# Patient Record
Sex: Female | Born: 1935 | Race: White | Hispanic: No
Health system: Southern US, Community
[De-identification: ages and names within clinical notes are randomized; demographics above are authoritative.]

---

## 2004-03-26 ENCOUNTER — Ambulatory Visit: Payer: Self-pay | Admitting: Neurology

## 2005-08-13 ENCOUNTER — Ambulatory Visit: Payer: Self-pay | Admitting: Unknown Physician Specialty

## 2006-04-15 ENCOUNTER — Other Ambulatory Visit: Payer: Self-pay

## 2006-04-15 ENCOUNTER — Inpatient Hospital Stay: Payer: Self-pay | Admitting: Specialist

## 2006-05-06 ENCOUNTER — Ambulatory Visit: Payer: Self-pay | Admitting: Internal Medicine

## 2006-05-26 ENCOUNTER — Ambulatory Visit: Payer: Self-pay | Admitting: Internal Medicine

## 2007-01-09 ENCOUNTER — Emergency Department: Payer: Self-pay | Admitting: Emergency Medicine

## 2007-01-28 ENCOUNTER — Other Ambulatory Visit: Payer: Self-pay

## 2007-01-28 ENCOUNTER — Observation Stay: Payer: Self-pay | Admitting: Internal Medicine

## 2007-12-02 ENCOUNTER — Ambulatory Visit: Payer: Self-pay | Admitting: Internal Medicine

## 2009-08-31 ENCOUNTER — Inpatient Hospital Stay: Payer: Self-pay | Admitting: Internal Medicine

## 2009-11-20 ENCOUNTER — Ambulatory Visit: Payer: Self-pay | Admitting: Internal Medicine

## 2011-02-28 ENCOUNTER — Telehealth: Payer: Self-pay | Admitting: *Deleted

## 2011-02-28 DIAGNOSIS — B029 Zoster without complications: Secondary | ICD-10-CM

## 2011-02-28 MED ORDER — ACYCLOVIR 400 MG PO TABS: 400.0000 mg | ORAL_TABLET | Freq: Every day | ORAL | Status: DC

## 2011-02-28 MED ORDER — ACYCLOVIR 400 MG PO TABS: 400.0000 mg | ORAL_TABLET | Freq: Every day | ORAL | Status: AC

## 2011-02-28 NOTE — Telephone Encounter (Signed)
Hospice RN called, She said she spoke with Dr Dan Humphreys in DR Tullo's absence yesterday. RN is concerned about shingles and labs were ordered to determine kidney function. They should have been faxed over today, will look for labs now.

## 2011-02-28 NOTE — Telephone Encounter (Signed)
Message copied by Duncan Dull on Fri Feb 28, 2011  1:05 PM ------      Message from: Ronna Polio A      Created: Thu Feb 27, 2011  4:43 PM      Regarding: Possible shingles       Pt hospice nurse called to tell you that pt has rash over hip and extending down leg which she thinks may be shingles.  Rash was described as purple streak with some possible vesicles.        Did not sound like cellulitis (or shingles really). Pt is afebrile. Pt reportedly has h/o kidney disease. We have no records in Epic on her and I am not familiar with her.      Nurse wanted to start Valtrex, but I was concerned without knowing her renal function/liver function. Nurse did not know why pt on hospice?      So, nurse drawing CBC and CMP and will touch base with you tomorrow.      Thanks

## 2011-02-28 NOTE — Telephone Encounter (Signed)
Lab recived, pending review from MD

## 2011-02-28 NOTE — Telephone Encounter (Signed)
Labs are normal,  agre with starting acyclovir 400 mg 5 times daily x 10 days.  EPIC updated.  Can you call in?  thanks

## 2011-02-28 NOTE — Telephone Encounter (Signed)
Hospice RN informed, RX sent in and order faxed to Hospice - Fax # [phone]

## 2011-03-20 ENCOUNTER — Encounter: Payer: Self-pay | Admitting: Internal Medicine

## 2011-05-13 ENCOUNTER — Ambulatory Visit: Payer: Self-pay | Admitting: Internal Medicine

## 2011-05-13 LAB — CBC WITH DIFFERENTIAL/PLATELET
Basophil #: 0 x10 3/mm 3
Basophil %: 0.2 %
Eosinophil #: 0.1 x10 3/mm 3
Eosinophil %: 0.3 %
HCT: 39.1 %
HGB: 13.4 g/dL
Lymphocyte %: 13 %
Lymphs Abs: 2.5 x10 3/mm 3
MCH: 30.6 pg
MCHC: 34.4 g/dL
MCV: 89 fL
Monocyte #: 0.9 x10 3/mm 3 — ABNORMAL HIGH
Monocyte %: 4.9 %
Neutrophil #: 15.6 x10 3/mm 3 — ABNORMAL HIGH
Neutrophil %: 81.6 %
Platelet: 189 x10 3/mm 3
RBC: 4.39 X10 6/mm 3
RDW: 12.1 %
WBC: 19.1 x10 3/mm 3 — ABNORMAL HIGH

## 2011-05-13 LAB — COMPREHENSIVE METABOLIC PANEL
Anion Gap: 8 (ref 7–16)
Calcium, Total: 9.5 mg/dL (ref 8.5–10.1)
Chloride: 107 mmol/L (ref 98–107)
Co2: 32 mmol/L (ref 21–32)
EGFR (African American): >60
EGFR (Non-African Amer.): 52 — ABNORMAL LOW
Potassium: 3.6 mmol/L (ref 3.5–5.1)
SGOT(AST): 27 U/L (ref 15–37)
SGPT (ALT): 25 U/L

## 2011-05-14 ENCOUNTER — Inpatient Hospital Stay: Payer: Self-pay | Admitting: Internal Medicine

## 2011-05-14 LAB — URINALYSIS, COMPLETE
Bilirubin,UR: NEGATIVE
Glucose,UR: NEGATIVE mg/dL (ref 0–75)
Hyaline Cast: 1
Leukocyte Esterase: NEGATIVE
RBC,UR: 4 /HPF (ref 0–5)
Squamous Epithelial: 3
WBC UR: 4 /HPF (ref 0–5)

## 2011-05-15 LAB — BASIC METABOLIC PANEL
BUN: 27 mg/dL — ABNORMAL HIGH (ref 7–18)
Calcium, Total: 8.1 mg/dL — ABNORMAL LOW (ref 8.5–10.1)
Chloride: 112 mmol/L — ABNORMAL HIGH (ref 98–107)
Co2: 25 mmol/L (ref 21–32)
EGFR (Non-African Amer.): 51 — ABNORMAL LOW
Osmolality: 306 (ref 275–301)
Potassium: 2.9 mmol/L — ABNORMAL LOW (ref 3.5–5.1)
Sodium: 149 mmol/L — ABNORMAL HIGH (ref 136–145)

## 2011-05-15 LAB — CBC WITH DIFFERENTIAL/PLATELET
Basophil %: 0.6 %
Eosinophil %: 2.9 %
HGB: 10.3 g/dL — ABNORMAL LOW (ref 12.0–16.0)
Lymphocyte #: 2.9 10*3/uL (ref 1.0–3.6)
MCV: 89 fL (ref 80–100)
Monocyte %: 6.2 %
Neutrophil #: 7.5 10*3/uL — ABNORMAL HIGH (ref 1.4–6.5)
RBC: 3.41 10*6/uL — ABNORMAL LOW (ref 3.80–5.20)

## 2011-05-15 LAB — LIPID PANEL
Cholesterol: 132 mg/dL (ref 0–200)
VLDL Cholesterol, Calc: 29 mg/dL (ref 5–40)

## 2011-05-17 LAB — CBC WITH DIFFERENTIAL/PLATELET
Basophil %: 0.6 %
Eosinophil %: 4.9 %
HGB: 10 g/dL — ABNORMAL LOW (ref 12.0–16.0)
Lymphocyte #: 2.8 10*3/uL (ref 1.0–3.6)
Lymphocyte %: 30 %
MCH: 30.1 pg (ref 26.0–34.0)
MCV: 89 fL (ref 80–100)
Monocyte #: 0.7 10*3/uL (ref 0.0–0.7)
Monocyte %: 6.9 %
Neutrophil #: 5.4 10*3/uL (ref 1.4–6.5)
Neutrophil %: 57.6 %
RBC: 3.34 10*6/uL — ABNORMAL LOW (ref 3.80–5.20)
WBC: 9.4 10*3/uL (ref 3.6–11.0)

## 2011-05-17 LAB — RENAL FUNCTION PANEL
Albumin: 2.2 g/dL — ABNORMAL LOW (ref 3.4–5.0)
Anion Gap: 11 (ref 7–16)
Calcium, Total: 7.8 mg/dL — ABNORMAL LOW (ref 8.5–10.1)
Creatinine: 0.84 mg/dL (ref 0.60–1.30)
EGFR (African American): >60
Glucose: 140 mg/dL — ABNORMAL HIGH (ref 65–99)
Osmolality: 287 (ref 275–301)
Potassium: 3.7 mmol/L (ref 3.5–5.1)
Sodium: 143 mmol/L (ref 136–145)

## 2011-05-17 LAB — MAGNESIUM: Magnesium: 1.8 mg/dL

## 2011-05-17 LAB — SEDIMENTATION RATE: Erythrocyte Sed Rate: 63 mm/hr — ABNORMAL HIGH (ref 0–30)

## 2011-05-17 LAB — OCCULT BLOOD X 1 CARD TO LAB, STOOL: Occult Blood, Feces: NEGATIVE

## 2011-05-17 LAB — CLOSTRIDIUM DIFFICILE BY PCR

## 2011-05-18 LAB — RENAL FUNCTION PANEL
Anion Gap: 8 (ref 7–16)
BUN: 9 mg/dL (ref 7–18)
Calcium, Total: 7.9 mg/dL — ABNORMAL LOW (ref 8.5–10.1)
EGFR (African American): >60
EGFR (Non-African Amer.): >60
Osmolality: 287 (ref 275–301)
Phosphorus: 2.2 mg/dL — ABNORMAL LOW (ref 2.5–4.9)
Sodium: 144 mmol/L (ref 136–145)

## 2011-05-19 ENCOUNTER — Encounter: Payer: Self-pay | Admitting: Internal Medicine

## 2011-05-19 ENCOUNTER — Telehealth: Payer: Self-pay | Admitting: *Deleted

## 2011-05-19 DIAGNOSIS — G060 Intracranial abscess and granuloma: Secondary | ICD-10-CM | POA: Insufficient documentation

## 2011-05-19 DIAGNOSIS — E119 Type 2 diabetes mellitus without complications: Secondary | ICD-10-CM | POA: Insufficient documentation

## 2011-05-19 DIAGNOSIS — D539 Nutritional anemia, unspecified: Secondary | ICD-10-CM | POA: Insufficient documentation

## 2011-05-19 DIAGNOSIS — R197 Diarrhea, unspecified: Secondary | ICD-10-CM | POA: Insufficient documentation

## 2011-05-19 DIAGNOSIS — F0390 Unspecified dementia without behavioral disturbance: Secondary | ICD-10-CM | POA: Insufficient documentation

## 2011-05-19 DIAGNOSIS — K1120 Sialoadenitis, unspecified: Secondary | ICD-10-CM | POA: Insufficient documentation

## 2011-05-19 LAB — STOOL CULTURE

## 2011-05-19 LAB — CULTURE, BLOOD (SINGLE)

## 2011-05-19 NOTE — Telephone Encounter (Signed)
Cbc with diff,  ESR, and vancomycin trough

## 2011-05-19 NOTE — Telephone Encounter (Signed)
Home health nurse called asking for verbal ok to draw labs on 1/12 and weekly thereafter.  Please advise.

## 2011-05-20 ENCOUNTER — Telehealth: Payer: Self-pay | Admitting: *Deleted

## 2011-05-20 NOTE — Telephone Encounter (Signed)
Qty 30 on amlodipine, seroquel, and lomotil,.  Lantus is one bottle/vial  Per month.   3 refills on all.

## 2011-05-20 NOTE — Telephone Encounter (Signed)
Advised pharmacist at [facility].  Quantity of 60 given on seroquel because the patient takes one twice a day.  Also ok'd sertraline 100 mg's twice a day for a quantity of 60 with 3 refills.  Home health nurse had not mentioned that one.

## 2011-05-20 NOTE — Telephone Encounter (Signed)
Pt was recently discharged from the hospital.  Her meds were called to [facility] but quantities weren't called in.  We need to call in the quantities so that the meds can be filled.  The meds are norvasc, seroquel, lomotil, lantus.

## 2011-05-20 NOTE — Telephone Encounter (Signed)
Advised home health nurse

## 2011-05-28 ENCOUNTER — Telehealth: Payer: Self-pay | Admitting: Internal Medicine

## 2011-05-28 ENCOUNTER — Encounter: Payer: Self-pay | Admitting: Internal Medicine

## 2011-05-28 NOTE — Telephone Encounter (Signed)
Brandy Pacheco's vancomycin trough level was too high,  Please call [facility] to find out if they have a pharmacist who can adjust her vancomycin dose.  I was told when she was discharged that the company that supplied the medication could do this

## 2011-05-28 NOTE — Telephone Encounter (Signed)
Spoke with nurse Marchelle Folks.  She will check with her office and make sure they faxed a copy of pt's lab results to the supplier.  She will let us know.

## 2011-05-30 ENCOUNTER — Telehealth: Payer: Self-pay | Admitting: *Deleted

## 2011-05-30 ENCOUNTER — Ambulatory Visit: Payer: Self-pay | Admitting: Internal Medicine

## 2011-05-30 ENCOUNTER — Inpatient Hospital Stay: Payer: Self-pay | Admitting: *Deleted

## 2011-05-30 LAB — COMPREHENSIVE METABOLIC PANEL
Albumin: 2.4 g/dL — ABNORMAL LOW (ref 3.4–5.0)
Alkaline Phosphatase: 56 U/L (ref 50–136)
Anion Gap: 14 (ref 7–16)
BUN: 9 mg/dL (ref 7–18)
Bilirubin,Total: 0.4 mg/dL (ref 0.2–1.0)
Co2: 30 mmol/L (ref 21–32)
Creatinine: 1.02 mg/dL (ref 0.60–1.30)
EGFR (African American): >60
EGFR (Non-African Amer.): 55 — ABNORMAL LOW
Glucose: 179 mg/dL — ABNORMAL HIGH (ref 65–99)
Osmolality: 292 (ref 275–301)
SGOT(AST): 22 U/L (ref 15–37)
SGPT (ALT): 13 U/L
Total Protein: 7.1 g/dL (ref 6.4–8.2)

## 2011-05-30 LAB — CBC
HCT: 31.7 % — ABNORMAL LOW (ref 35.0–47.0)
HGB: 10.9 g/dL — ABNORMAL LOW (ref 12.0–16.0)
MCH: 30.2 pg (ref 26.0–34.0)
MCHC: 34.4 g/dL (ref 32.0–36.0)
Platelet: 258 10*3/uL (ref 150–440)
RBC: 3.61 10*6/uL — ABNORMAL LOW (ref 3.80–5.20)
RDW: 13.4 % (ref 11.5–14.5)

## 2011-05-30 LAB — CK TOTAL AND CKMB (NOT AT ARMC): CK-MB: <0.5 ng/mL — ABNORMAL LOW (ref 0.5–3.6)

## 2011-05-30 LAB — VANCOMYCIN, TROUGH: Vancomycin, Trough: 39 ug/mL (ref 10–20)

## 2011-05-30 NOTE — Telephone Encounter (Signed)
Home health nurse called to report that pt is at [facility] with chest pain and BP of 199/91.  Magnesium and potassium levels are down so they will probably keep her there.

## 2011-05-31 LAB — BASIC METABOLIC PANEL
Anion Gap: 10 (ref 7–16)
BUN: 10 mg/dL (ref 7–18)
Calcium, Total: 7.9 mg/dL — ABNORMAL LOW (ref 8.5–10.1)
Co2: 30 mmol/L (ref 21–32)
EGFR (African American): 57 — ABNORMAL LOW
EGFR (Non-African Amer.): 47 — ABNORMAL LOW
Glucose: 170 mg/dL — ABNORMAL HIGH (ref 65–99)
Osmolality: 286 (ref 275–301)
Potassium: 2.4 mmol/L — CL (ref 3.5–5.1)
Sodium: 142 mmol/L (ref 136–145)

## 2011-05-31 LAB — VANCOMYCIN, TROUGH: Vancomycin, Trough: 24 ug/mL (ref 10–20)

## 2011-05-31 LAB — MAGNESIUM: Magnesium: 2.2 mg/dL

## 2011-06-01 LAB — BASIC METABOLIC PANEL
Anion Gap: 10 (ref 7–16)
BUN: 12 mg/dL (ref 7–18)
Chloride: 108 mmol/L — ABNORMAL HIGH (ref 98–107)
Co2: 29 mmol/L (ref 21–32)
Creatinine: 1.32 mg/dL — ABNORMAL HIGH (ref 0.60–1.30)
Potassium: 3.1 mmol/L — ABNORMAL LOW (ref 3.5–5.1)

## 2011-06-02 ENCOUNTER — Telehealth: Payer: Self-pay | Admitting: Internal Medicine

## 2011-06-02 LAB — BASIC METABOLIC PANEL
BUN: 13 mg/dL (ref 7–18)
Chloride: 107 mmol/L (ref 98–107)
EGFR (Non-African Amer.): 47 — ABNORMAL LOW
Glucose: 127 mg/dL — ABNORMAL HIGH (ref 65–99)
Osmolality: 296 (ref 275–301)
Potassium: 2.9 mmol/L — ABNORMAL LOW (ref 3.5–5.1)
Sodium: 148 mmol/L — ABNORMAL HIGH (ref 136–145)

## 2011-06-02 LAB — POTASSIUM: Potassium: 3.9 mmol/L (ref 3.5–5.1)

## 2011-06-02 LAB — SODIUM: Sodium: 148 mmol/L — ABNORMAL HIGH (ref 136–145)

## 2011-06-02 LAB — MAGNESIUM: Magnesium: 1.5 mg/dL — ABNORMAL LOW

## 2011-06-02 NOTE — Telephone Encounter (Signed)
[phone]    FAX [phone] BONNIE @ [facility] CALLED WANTING LAST NOTES FOR [facility]     DID WE RECEIVE RESULTS FOR THE CT OF HEAD WITH AND WITH OUT CONTRAST 05/30/11 ON ORDER IT STATED DR TULLO WANTED 2 WEEK FOLLOW UP/[facility]

## 2011-06-03 LAB — BASIC METABOLIC PANEL
Calcium, Total: 7.7 mg/dL — ABNORMAL LOW (ref 8.5–10.1)
Co2: 30 mmol/L (ref 21–32)
EGFR (Non-African Amer.): 49 — ABNORMAL LOW
Osmolality: 294 (ref 275–301)
Potassium: 3.6 mmol/L (ref 3.5–5.1)
Sodium: 148 mmol/L — ABNORMAL HIGH (ref 136–145)

## 2011-06-03 LAB — SODIUM: Sodium: 146 mmol/L — ABNORMAL HIGH (ref 136–145)

## 2011-06-03 LAB — MAGNESIUM: Magnesium: 2 mg/dL

## 2011-06-04 ENCOUNTER — Encounter: Payer: Self-pay | Admitting: Internal Medicine

## 2011-06-04 NOTE — Telephone Encounter (Signed)
The patient has not been seen in the office because she was on home hospice.  So there are no notes. I did see the results of the CT

## 2011-06-09 NOTE — Telephone Encounter (Signed)
Kendal Hymen notified that we have no notes.

## 2011-06-13 ENCOUNTER — Ambulatory Visit: Payer: Self-pay | Admitting: Internal Medicine

## 2011-06-13 ENCOUNTER — Telehealth: Payer: Self-pay | Admitting: Internal Medicine

## 2011-06-13 NOTE — Telephone Encounter (Signed)
Per Dr. Darrick Huntsman it is okay that she cancel appt., she doesn't need to see her right now.

## 2011-06-13 NOTE — Telephone Encounter (Signed)
[phone] Pt daughter called to cancel appointment   Unless dr Darrick Huntsman would like to see pt.  Pt would have to be brought to office by ems.  Daughter stated that hospice comes out weekly.

## 2011-06-16 ENCOUNTER — Ambulatory Visit: Payer: Self-pay | Admitting: Internal Medicine

## 2011-06-16 ENCOUNTER — Encounter: Payer: Self-pay | Admitting: Internal Medicine

## 2011-08-05 ENCOUNTER — Telehealth: Payer: Self-pay | Admitting: Internal Medicine

## 2011-08-05 NOTE — Telephone Encounter (Signed)
[phone] Pt daughter called to get refill on  One touch ultra strips Lancets Bridget Hartshorn  [phone]  Pt is out of strips and pt has not had insulin 3 days

## 2011-08-06 ENCOUNTER — Telehealth: Payer: Self-pay | Admitting: Internal Medicine

## 2011-08-06 MED ORDER — GLUCOSE BLOOD VI STRP: ORAL_STRIP | Status: DC

## 2011-08-06 MED ORDER — ONETOUCH LANCETS MISC: Status: DC

## 2011-08-06 MED ORDER — AMOXICILLIN-POT CLAVULANATE 600-42.9 MG/5ML PO SUSR: 600.0000 mg | Freq: Two times a day (BID) | ORAL | Status: AC

## 2011-08-06 NOTE — Telephone Encounter (Signed)
The last time patient had this and was seen by ENT they recommended increased hydration (encourage oral intake)...  Lemon swabs (to get the salivar gland to secrete) , gentle massage of the gland,  , a humidifer in the room and antibiotics.  If they want to try this at home , that is up to them.  I am happy to call in an antibiotic (Augmentin; I will do a liquid form if they prefer)

## 2011-08-06 NOTE — Telephone Encounter (Signed)
Marchelle Folks from [facility] called and stated patient has another salivary gland blocked, she stated there is pus in the patients mouth patient does not have a fever.  She wanted to know if you wanted to call in an antibiotic or send patient to the hospital.  Please advise.    Marchelle Folks also wanted to know if you could call her, she wants to talk to you about a feeding tube for the patient.   [phone]

## 2011-08-06 NOTE — Telephone Encounter (Signed)
I would like to have a face to face with daughter lettie about her request for feeding tube placement.

## 2011-08-06 NOTE — Telephone Encounter (Signed)
1. Spoke w/Hospice RN - They will proceed w/below, Dr Darrick Huntsman aware and RX sent in.   2. Daughter would like order for feeding tube placed. RN explained risks and issues surrounding placement and possibility that it will not be successful. Daughter is Frederik Pear [phone]. Dr Darrick Huntsman, do you want [place] to schedule an apt to discuss? Or did you want to call pt's daughter? (We need to call Hospice RN once decision has been made) Pt's family would like to see Dr Markham Jordan for GI MD if needed.

## 2011-08-06 NOTE — Telephone Encounter (Signed)
Rx sent to pharmacy   

## 2011-08-07 ENCOUNTER — Other Ambulatory Visit: Payer: Self-pay | Admitting: Internal Medicine

## 2011-08-07 MED ORDER — ONETOUCH LANCETS MISC: Status: AC

## 2011-08-07 MED ORDER — GLUCOSE BLOOD VI STRP: ORAL_STRIP | Status: AC

## 2011-08-07 NOTE — Telephone Encounter (Signed)
Brandy Pacheco made an appt for Monday for face to face to discuss feeding tube.

## 2011-08-09 ENCOUNTER — Inpatient Hospital Stay: Payer: Self-pay | Admitting: *Deleted

## 2011-08-09 LAB — CBC
HGB: 13.9 g/dL (ref 12.0–16.0)
MCH: 28.8 pg (ref 26.0–34.0)
MCHC: 32.8 g/dL (ref 32.0–36.0)
MCV: 88 fL (ref 80–100)
Platelet: 164 10*3/uL (ref 150–440)
RDW: 14.2 % (ref 11.5–14.5)
WBC: 13.1 10*3/uL — ABNORMAL HIGH (ref 3.6–11.0)

## 2011-08-09 LAB — BASIC METABOLIC PANEL
Anion Gap: 12 (ref 7–16)
BUN: 76 mg/dL — ABNORMAL HIGH (ref 7–18)
BUN: 73 mg/dL — ABNORMAL HIGH (ref 7–18)
BUN: 68 mg/dL — ABNORMAL HIGH (ref 7–18)
Calcium, Total: 9.1 mg/dL (ref 8.5–10.1)
Chloride: 124 mmol/L — ABNORMAL HIGH (ref 98–107)
Chloride: 123 mmol/L — ABNORMAL HIGH (ref 98–107)
Chloride: 117 mmol/L — ABNORMAL HIGH (ref 98–107)
Co2: 26 mmol/L (ref 21–32)
Creatinine: 2.6 mg/dL — ABNORMAL HIGH (ref 0.60–1.30)
EGFR (African American): 22 — ABNORMAL LOW
EGFR (African American): 23 — ABNORMAL LOW
EGFR (African American): 23 — ABNORMAL LOW
EGFR (Non-African Amer.): 19 — ABNORMAL LOW
EGFR (Non-African Amer.): 19 — ABNORMAL LOW
Glucose: 231 mg/dL — ABNORMAL HIGH (ref 65–99)
Glucose: 263 mg/dL — ABNORMAL HIGH (ref 65–99)
Glucose: 287 mg/dL — ABNORMAL HIGH (ref 65–99)
Osmolality: 338 (ref 275–301)
Potassium: 3.2 mmol/L — ABNORMAL LOW (ref 3.5–5.1)
Sodium: >160 mmol/L (ref 136–145)
Sodium: 155 mmol/L — ABNORMAL HIGH (ref 136–145)

## 2011-08-09 LAB — COMPREHENSIVE METABOLIC PANEL
Bilirubin,Total: 0.3 mg/dL (ref 0.2–1.0)
Calcium, Total: 9.4 mg/dL (ref 8.5–10.1)
Chloride: 127 mmol/L — ABNORMAL HIGH (ref 98–107)
Co2: 22 mmol/L (ref 21–32)
Creatinine: 2.54 mg/dL — ABNORMAL HIGH (ref 0.60–1.30)
EGFR (African American): 23 — ABNORMAL LOW
EGFR (Non-African Amer.): 19 — ABNORMAL LOW
SGOT(AST): 40 U/L — ABNORMAL HIGH (ref 15–37)
SGPT (ALT): 26 U/L
Total Protein: 7.3 g/dL (ref 6.4–8.2)

## 2011-08-09 LAB — URINALYSIS, COMPLETE
Bilirubin,UR: NEGATIVE
Glucose,UR: NEGATIVE mg/dL (ref 0–75)
Leukocyte Esterase: NEGATIVE
Ph: 5 (ref 4.5–8.0)
Specific Gravity: 1.02 (ref 1.003–1.030)

## 2011-08-09 LAB — CK TOTAL AND CKMB (NOT AT ARMC)
CK, Total: 19 U/L — ABNORMAL LOW (ref 21–215)
CK, Total: 19 U/L — ABNORMAL LOW (ref 21–215)
CK-MB: <0.5 ng/mL — ABNORMAL LOW (ref 0.5–3.6)
CK-MB: 0.7 ng/mL (ref 0.5–3.6)
CK-MB: 0.6 ng/mL (ref 0.5–3.6)

## 2011-08-09 LAB — TROPONIN I
Troponin-I: 0.04 ng/mL
Troponin-I: 0.06 ng/mL — ABNORMAL HIGH

## 2011-08-10 LAB — COMPREHENSIVE METABOLIC PANEL
BUN: 59 mg/dL — ABNORMAL HIGH (ref 7–18)
Bilirubin,Total: 0.3 mg/dL (ref 0.2–1.0)
Calcium, Total: 8.2 mg/dL — ABNORMAL LOW (ref 8.5–10.1)
Chloride: 112 mmol/L — ABNORMAL HIGH (ref 98–107)
Co2: 26 mmol/L (ref 21–32)
EGFR (African American): 27 — ABNORMAL LOW
EGFR (Non-African Amer.): 22 — ABNORMAL LOW
Osmolality: 315 (ref 275–301)
Sodium: 148 mmol/L — ABNORMAL HIGH (ref 136–145)
Total Protein: 6.5 g/dL (ref 6.4–8.2)

## 2011-08-10 LAB — CBC WITH DIFFERENTIAL/PLATELET
Basophil #: 0.1 10*3/uL (ref 0.0–0.1)
Basophil %: 0.6 %
Eosinophil #: 0.5 10*3/uL (ref 0.0–0.7)
Eosinophil %: 4.4 %
HCT: 34.6 % — ABNORMAL LOW (ref 35.0–47.0)
Lymphocyte #: 2.7 10*3/uL (ref 1.0–3.6)
Lymphocyte %: 25.6 %
MCH: 28.4 pg (ref 26.0–34.0)
MCHC: 33.1 g/dL (ref 32.0–36.0)
Monocyte %: 5.6 %
Neutrophil #: 6.8 10*3/uL — ABNORMAL HIGH (ref 1.4–6.5)

## 2011-08-11 ENCOUNTER — Ambulatory Visit: Payer: Self-pay | Admitting: Internal Medicine

## 2011-08-11 LAB — BASIC METABOLIC PANEL
Anion Gap: 13 (ref 7–16)
Calcium, Total: 7.8 mg/dL — ABNORMAL LOW (ref 8.5–10.1)
Creatinine: 1.82 mg/dL — ABNORMAL HIGH (ref 0.60–1.30)
EGFR (African American): 34 — ABNORMAL LOW
EGFR (Non-African Amer.): 28 — ABNORMAL LOW
Glucose: 149 mg/dL — ABNORMAL HIGH (ref 65–99)
Potassium: 3.3 mmol/L — ABNORMAL LOW (ref 3.5–5.1)
Sodium: 143 mmol/L (ref 136–145)

## 2011-08-11 LAB — MAGNESIUM: Magnesium: 1.6 mg/dL — ABNORMAL LOW

## 2011-08-14 LAB — CULTURE, BLOOD (SINGLE)

## 2011-09-10 ENCOUNTER — Ambulatory Visit: Payer: Self-pay | Admitting: Internal Medicine

## 2011-09-10 DEATH — deceased

## 2012-11-06 IMAGING — CT CT HEAD WITH CONTRAST
1 series · 15 of 29 positions shown, 19 images · non-contrast
Comparison: none

REASON FOR EXAM: multiple suspected abscesses followup post abx
COMMENTS:

[Series 2: with · axial · 0.43mm/px · z∈[+132,+262]mm · 15 of 29 slices shown, 19 images]
[im 2/29  brain]
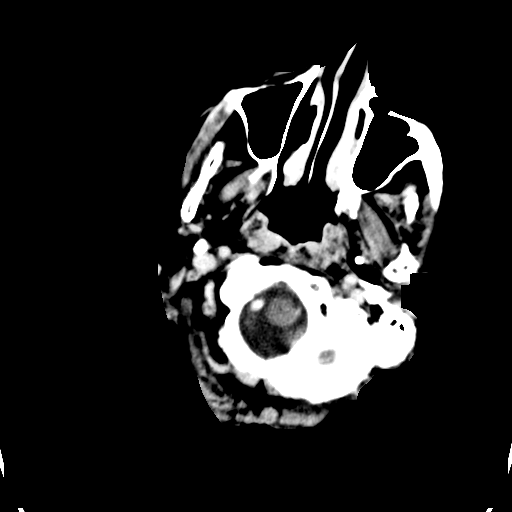
[im 2/29  bone]
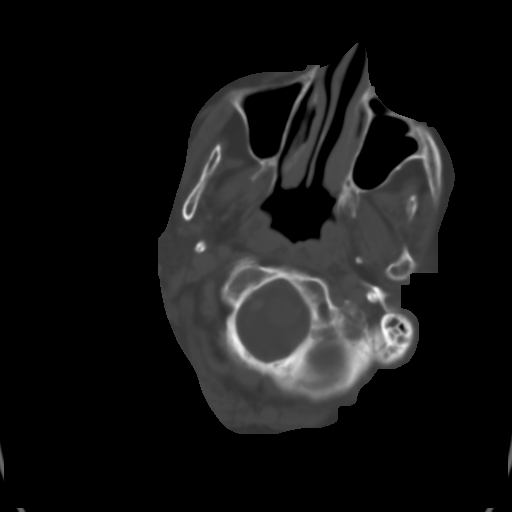
[im 4/29  brain]
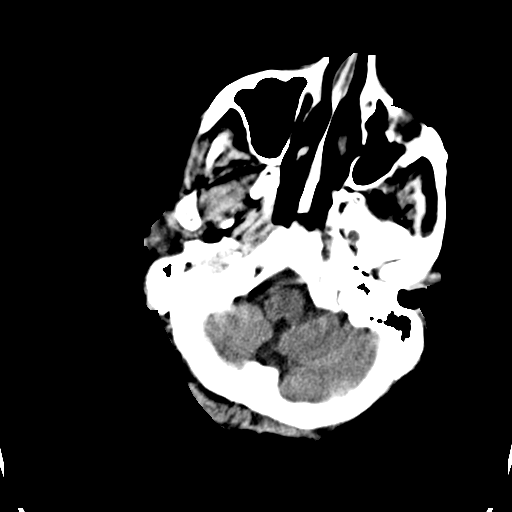
[im 6/29  brain]
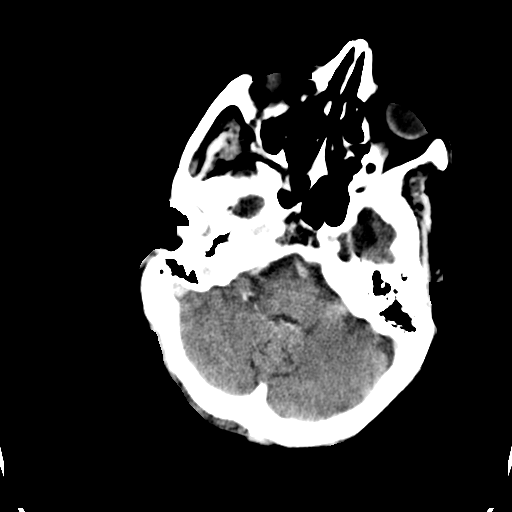
[im 8/29  brain]
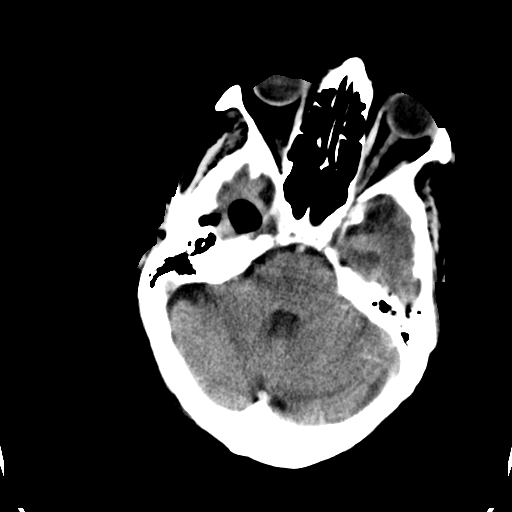
[im 10/29  brain]
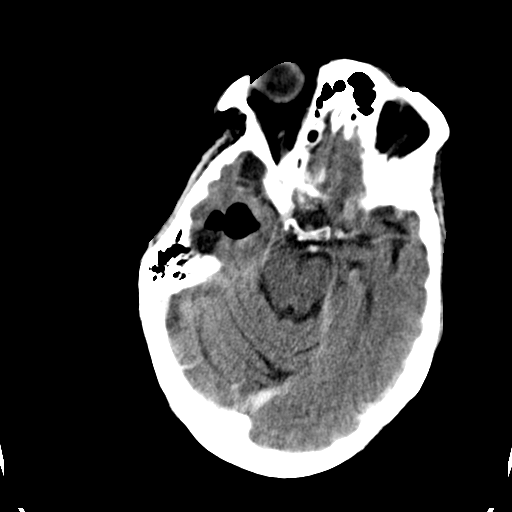
[im 10/29  bone]
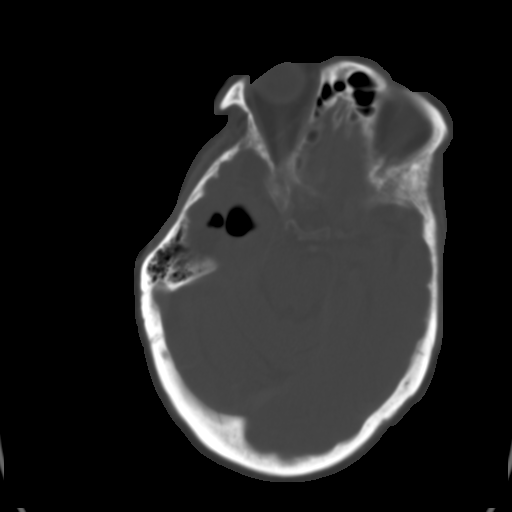
[im 11/29  brain]
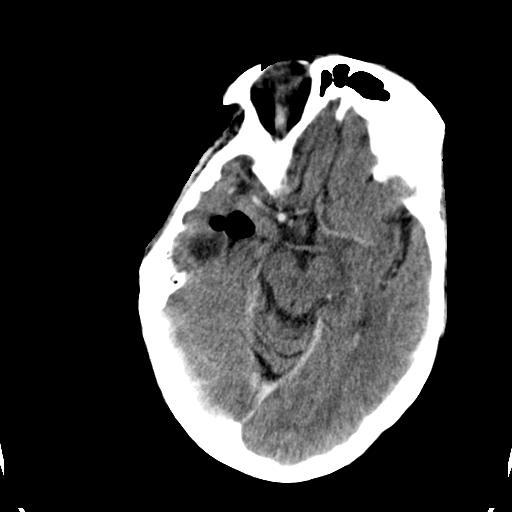
[im 13/29  brain]
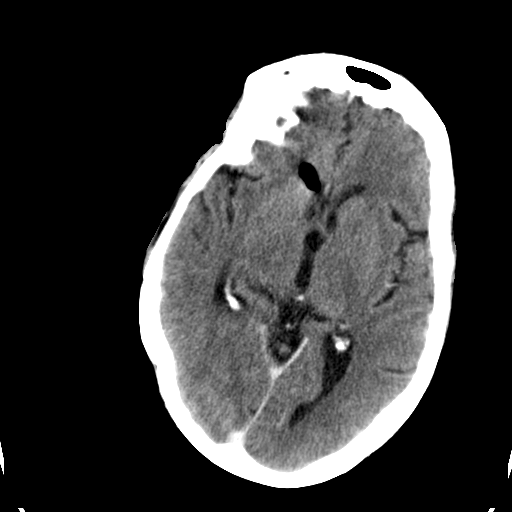
[im 15/29  brain]
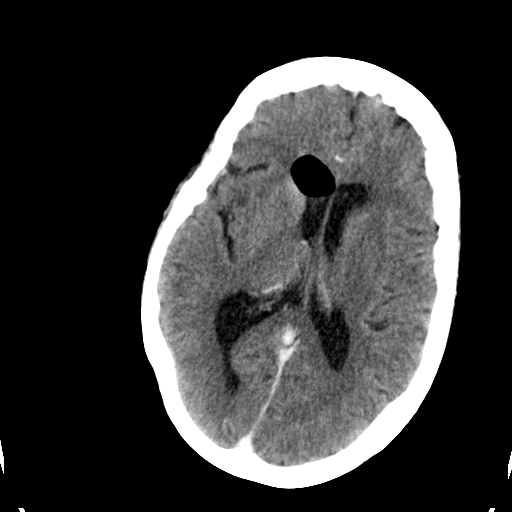
[im 17/29  brain]
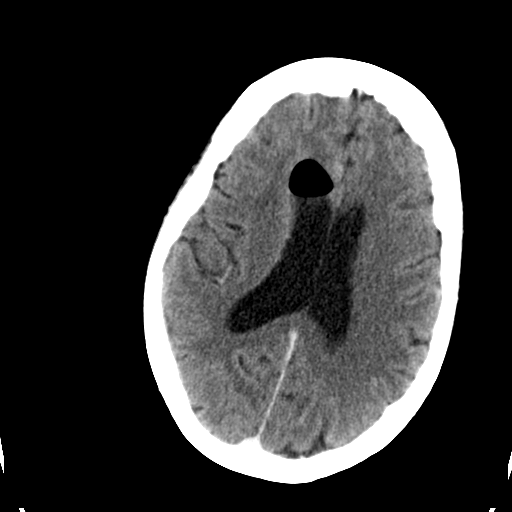
[im 17/29  bone]
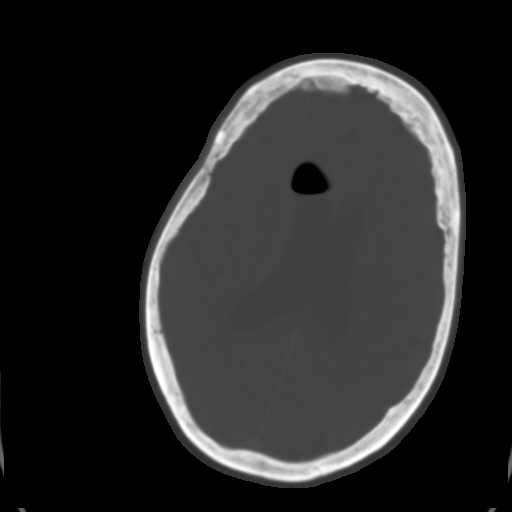
[im 19/29  brain]
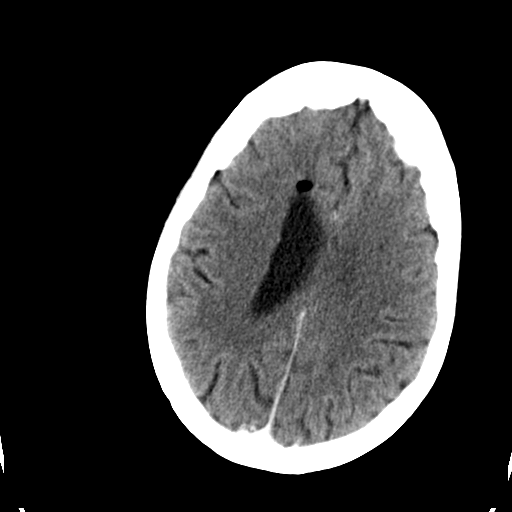
[im 20/29  brain]
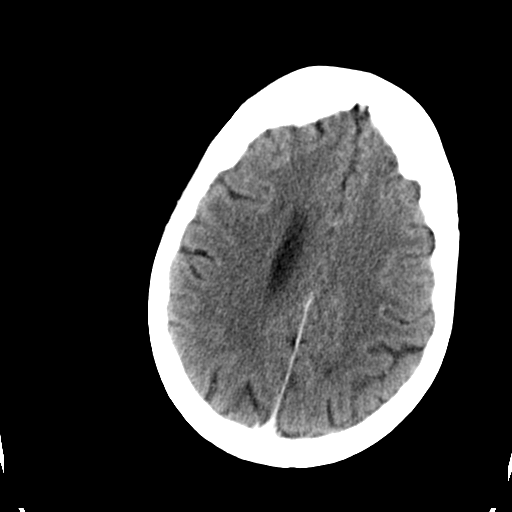
[im 22/29  brain]
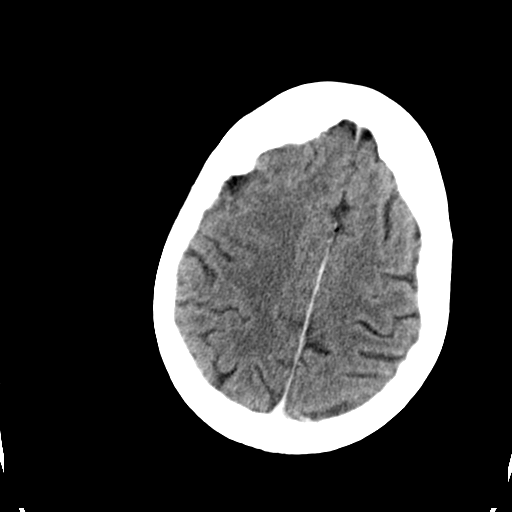
[im 24/29  brain]
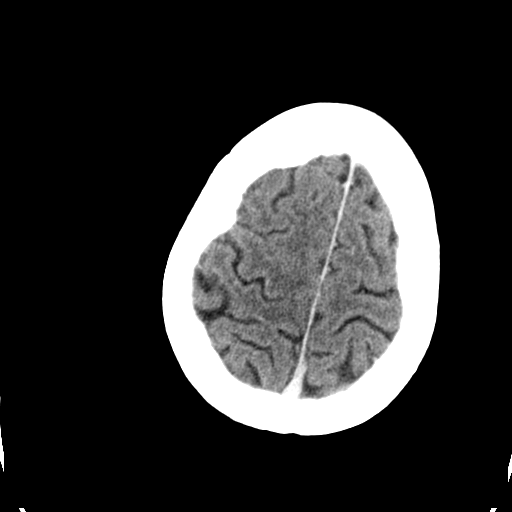
[im 24/29  bone]
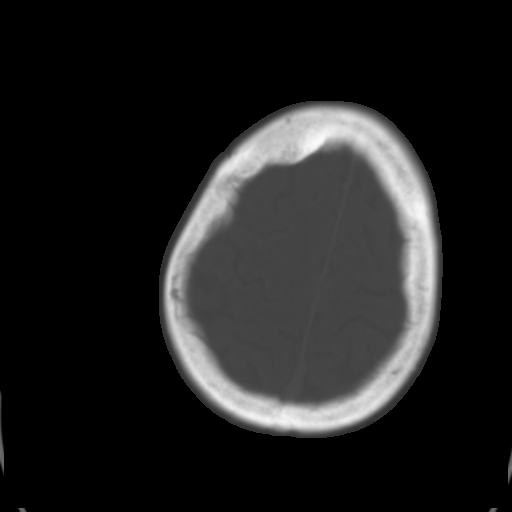
[im 26/29  brain]
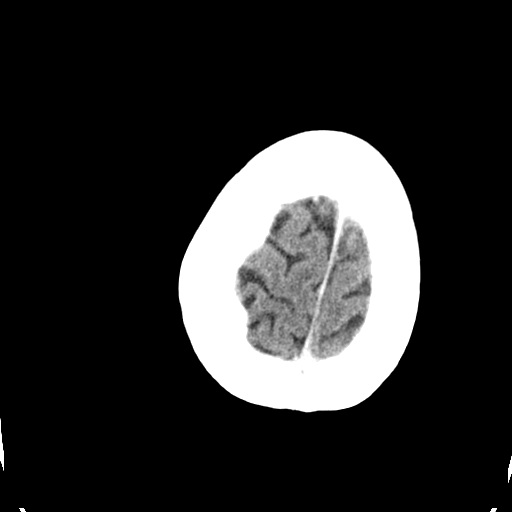
[im 28/29  brain]
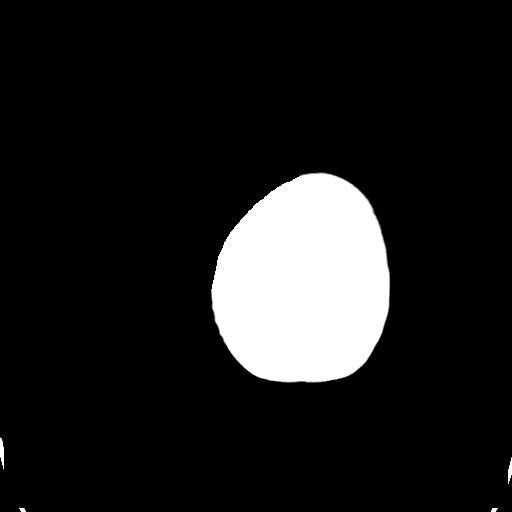

[15 of 29 positions shown; findings below may reference images not displayed]

PROCEDURE:     CT  - CT HEAD WITH CONTRAST  - May 18, 2011  [DATE]

RESULT:     CT of the brain with contrast is compared to the previous
noncontrast exam dated to May 2011.

There is persistence of air within the anterior horn of the right lateral
ventricle and in the right temporal fossa possibly in the tip of the lateral
ventricle and adjacent parenchyma with some evidence of encephalomalacia or
other lucency in the floor of the middle cranial fossa just anterior to the
inferior aspect of the pictures ridge. These changes were present
previously. The amount of air in the frontal horn of the right lateral
ventricle may be slightly smaller than seen previously. Low-attenuation in
the periventricular white matter is consistent with chronic small vessel
ischemic disease. No discrete mass or evidence of intracranial hemorrhage is
seen. No abnormal enhancement is identified. No fracture is evident. The
sinuses and mastoid show grossly normal aeration.
IMPRESSION: 1. Persistent abnormal air within the ventricular system and temporal fossa
on the right as discussed. Neurosurgical consultation is recommended. The
etiology is not definitely demonstrated on this study. No an enhancing mass
is identified. The right lateral ventricle frontal horn collection may be
slightly smaller that on the previous study.

## 2012-11-21 IMAGING — CR DG CHEST 1V PORT
1 series · 1 of 1 positions shown · non-contrast
Comparison: none

REASON FOR EXAM: chest congestion
COMMENTS:

[portable]
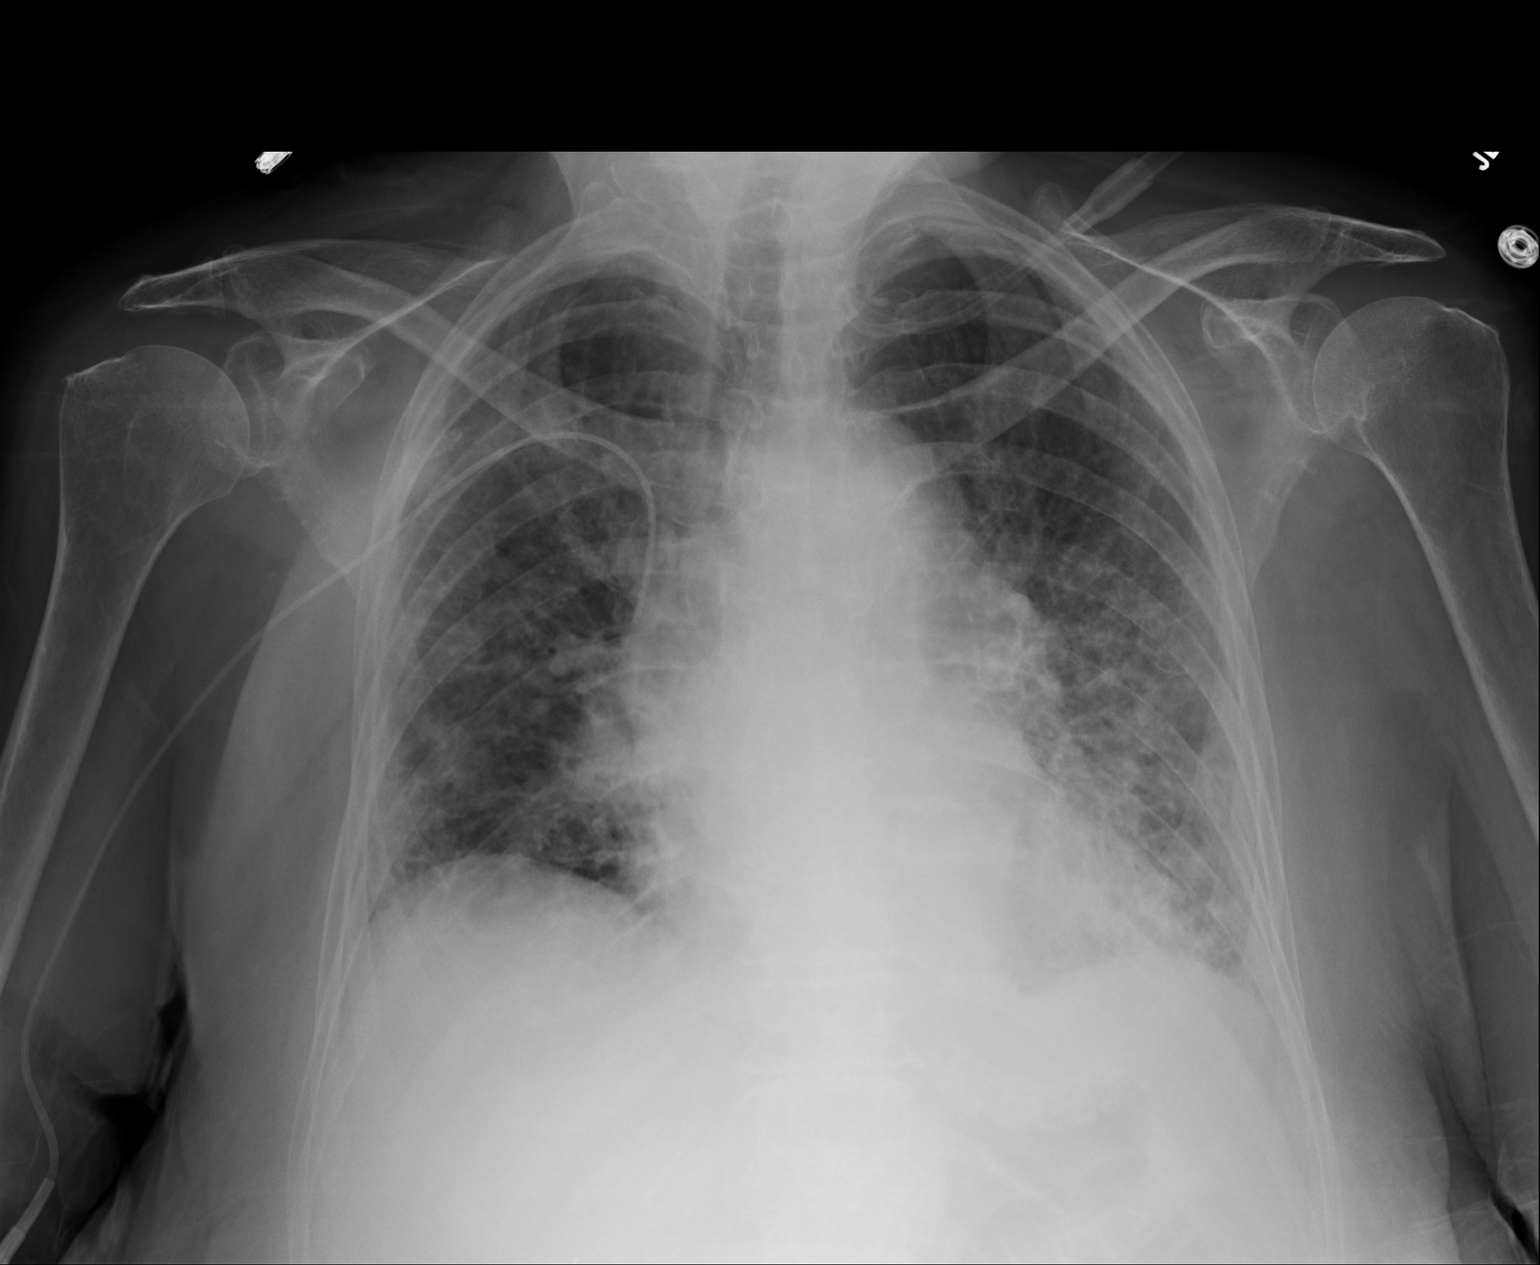

[1 of 1 positions shown; findings below may reference images not displayed]

PROCEDURE:     DXR - DXR PORTABLE CHEST SINGLE VIEW  - June 02, 2011 [DATE]

RESULT:

Frontal view of the chest is performed. Comparison is made to a prior study
dated 05/15/2011.

A right sided central venous catheter is appreciated with the tip projecting
in the region of the superior vena cava. The patient has taken a shallow
inspiration. There is thickening of the interstitial markings and
peribronchial cuffing. No focal regions of consolidation are appreciated.
The cardiac silhouette is within normal limits. The visualized bony skeleton
is unremarkable.
IMPRESSION: Interstitial infiltrate likely representing a component of
pulmonary edema. An underlying component of pulmonary fibrosis is also of
diagnostic consideration. Continued surveillance evaluation is recommended.

## 2013-01-27 IMAGING — CR DG CHEST 1V PORT
1 series · 1 of 1 positions shown · non-contrast
Comparison: none

REASON FOR EXAM: weakness
COMMENTS:

[x chest ap]
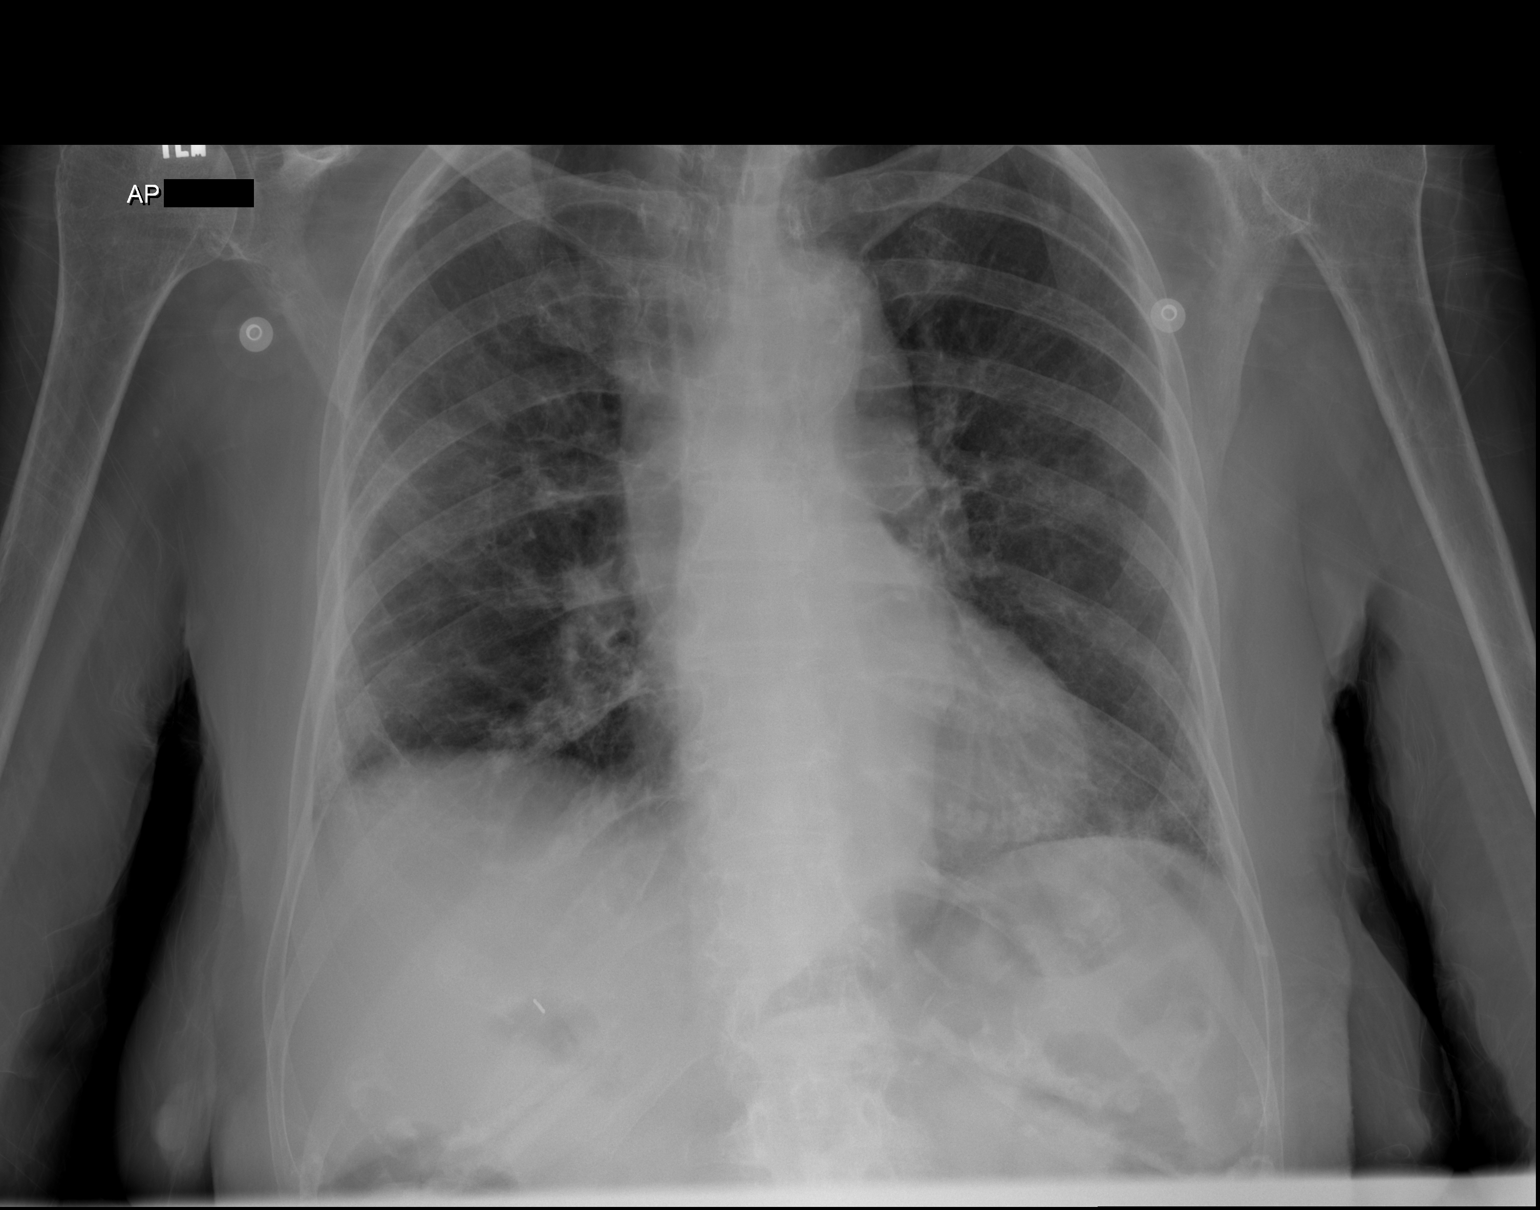

[1 of 1 positions shown; findings below may reference images not displayed]

PROCEDURE:     DXR - DXR PORTABLE CHEST SINGLE VIEW  - August 08, 2011 [DATE]

RESULT:     Comparison is made to the study June 02, 2011.

Lung markings are coarse but nonconfluent. These changes are greatest at the
right lung base. There is blunting of the right costophrenic angle. The
cardiac silhouette is normal. The bones are osteopenic. There is no
pneumothorax or focal consolidation. Early pneumonia, mild atelectasis or
fibrosis are differential considerations. Edema asymmetrically is felt to be
unlikely.
IMPRESSION: 1.     Patchy densities in the mid and lower right lung as discussed above.
2.     No cardiomegaly.
3.     Minimal right pleural thickening or effusion suggested.

## 2014-09-03 NOTE — H&P (Signed)
PATIENT NAME:  [name], [name] MR#:  [id number] DATE OF BIRTH:  [birth year]  DATE OF ADMISSION:  05/30/2011  PRIMARY CARE PHYSICIAN:  Duncan Dulleresa Tullo, MD  CHIEF COMPLAINT: Back pain.   HISTORY OF PRESENT ILLNESS: This is an 81 year old female who was in the hospital earlier this month diagnosed with a brain abscess, was put on antibiotics, and has been getting home IV antibiotics ever since. She was brought today to have a repeat CT scan done which showed improvement in the air fluid levels. She has been complaining about some back pain and possible chest pain so EMS suggested to stop by the ER. Here in the ER, she was found to have some severe electrolyte derangements with a potassium of 2.3 and a magnesium of 1.2. She has been having copious amounts of diarrhea according to her daughter.  This has been going on ever since she has been on antibiotics. She said Dr Darrick Huntsmanullo has checked for C. difficile before, which was negative. It is likely this is the cause of the electrolyte derangement. She was not complaining of any chest pain, and she appeared very comfortable and not complaining about back pain at this point, but we do want to keep her on a monitor for electrolyte replacement since she is so severely depleted.   PAST MEDICAL HISTORY:  1. Recent diagnosis of brain abscess.  2. Diabetes.  3. Hypertension.  4. Alzheimer's dementia.   PAST SURGICAL HISTORY:  1. Cataract surgery.  2. Cholecystectomy.   3. Goiter removal.   ALLERGIES: No known drug allergies.   CURRENT MEDICATIONS:  1. Seroquel 100 mg b.i.d.  2. Zoloft 100 mg b.i.d.  3. Amlodipine 5 mg daily.  4. Lantus 10 units subcutaneous daily.  5. Lomotil 2.5/0.025 mg q.6 hours p.r.n.  6. Ceftriaxone 2 grams q.12 hours.  7. Vancomycin 1 gram daily via IV.   8. Metronidazole 500 mg IV q.8 hours.   SOCIAL HISTORY: She stopped smoking 30 years ago but does dip snuff.   FAMILY HISTORY: Significant for stroke.   REVIEW OF SYSTEMS: Unable  to obtain from the patient because of her dementia.   PHYSICAL EXAMINATION:  VITAL SIGNS: Temperature is 98.3, pulse 90, respirations 20, blood pressure 213/91.   GENERAL: This is a well-nourished white female in no acute distress.   HEENT: The pupils are equal, round, reactive to light. The sclerae are not icteric. The oral mucosa is moist. Oropharynx: Clear. Nasopharynx is clear.   NECK: Supple. No JVD, lymphadenopathy, no thyromegaly.   CARDIOVASCULAR: Regular rate and rhythm. There is a 1/6 systolic murmur.   LUNGS: Clear to auscultation. No dullness to percussion. She is not using accessory muscles.   ABDOMEN: Soft, nontender, nondistended. Bowel sounds are positive. No hepatosplenomegaly. No masses.   EXTREMITIES: There is trace lower extremity edema.   NEUROLOGIC: Cranial nerves II through XII appear to be intact.  Difficult to get her to cooperate with all the exam. She moves all extremities. She is alert but not oriented.   SKIN: Moist with good turgor.   LABORATORIES: BUN is 9, creatinine 1.02, sodium 145, potassium 2.3. Magnesium is 1.2.   ASSESSMENT AND PLAN:  1. Severe hypokalemia. I suspect this is from the diarrhea. We will go ahead and replete this by IV and try to control her diarrhea better.  Want her on the monitor while we are repleting this since using IV and since it was severely so low.  2. Hypomagnesemia. I also think this is secondary  to the diarrhea.  We will replete her magnesium IV and also monitor her on telemetry monitor.  3. Brain abscess. She seems to be responding to the IV antibiotic treatment. We will continue that for now. We will discuss it in the a.m. with Dr. Darrick Huntsman about the length of therapy that is being considered, especially since she is having diarrhea from the antibiotics.  4. Diarrhea.  I think this is from the antibiotics. We will check for Clostridium difficile since she is on antibiotics and treat appropriately.  5. Back pain.  I am not  sure of the etiology at this point. She seems to be comfortable to I am not going to pursue any further workup.  6. Accelerated hypertension. This could be anxiety. I will continue her current medications and add some IV hydralazine p.r.n. if needed.    TIME SPENT ON ADMISSION: 45 minutes.      ____________________________ Gracelyn Nurse, MD jdj:vtd D: 05/30/2011 19:36:30 ET T: 05/31/2011 06:59:49 ET JOB#: [id number]  cc: Gracelyn Nurse, MD, <Dictator> Duncan Dull, MD Gracelyn Nurse MD ELECTRONICALLY SIGNED 06/02/2011 9:08

## 2014-09-03 NOTE — Consult Note (Signed)
PATIENT NAME:  Brandy Pacheco, Brandy Pacheco MR#:  [id number] DATE OF BIRTH:  [birth year]  DATE OF CONSULTATION:  05/14/2011  REFERRING PHYSICIAN:   CONSULTING PHYSICIAN:  Drelyn Pistilli A. Egbert GaribaldiBird, MD  REASON FOR CONSULTATION: Left neck mass.  HISTORY OF PRESENT ILLNESS: 81 year old significantly demented white female admitted to the medical service with progressive swelling of her left neck and dysphagia according to her family. Work-up demonstrates a white count that was elevated, hypernatremia, as well as imaging of the neck which demonstrates cellulitis of the left neck. Surgical service was consulted. The patient had a white count of 19.1 and has been placed on broad-spectrum antibiotics consisting of Rocephin. CT scan of the neck and head were obtained concerning for cellulitis of the left neck as well as air fluid level seen within the right lateral ventricle and right temporal lobe. The patient has been seen by the medicine service. She has also been seen by palliative care. She is a DO NOT RESUSCITATE and NO CODE. The patient's family does not want aggressive neurosurgical intervention. The history dates back for several days and she had a small lump in her left neck which then grew over the course of a day or so into a proximate size of a ping-pong ball. At that point, the patient's family brought her to medical attention.   ALLERGIES: None.   MEDICATIONS: Well defined in the chart.   PHYSICAL EXAMINATION: She is in the bed, noncommunicative. She is rather combative with any type of movement. There is a 3 cm mass in the anterior neck, anterior to the sternocleidomastoid and just underneath the ramus of the mandible. It is mobile, hard, expression of which demonstrates some purulent type fluid draining into the floor of the mouth on the left side. Intraoral examination is limited due to the patient's mental status. She is edentulous. The remaining physical examination is remarkable only for a bedridden patient with  dementia.   LABORATORY VALUES: White count 19.1, glucose 220, creatinine 1.07, sodium 147, potassium 3.6. Liver function tests are normal. Hemoglobin 13.4.   IMPRESSION: Acute left submandibular gland sialoadenitis versus infected lymph node? The patient looks to have likely chronic dehydration, given her mental status as well as for sodium being 147 which puts her risk for this. She is not on any anticholinergics that would precipitate this problem. Intracranial process could certainly be infectious and related to neck mass.  RECOMMENDATIONS: Antistaphylococcal and streptococcal coverage is sufficient. The patient needs adequate mouth care. I would suggest ENT evaluation to confirm these findings. She is a DNR and palliative care has become involved.  ____________________________ Redge GainerMark A. Egbert GaribaldiBird, MD FACS mab:rbg D: 05/14/2011 21:51:39 ET T: 05/15/2011 10:04:00 ET JOB#: [id number]  cc: Loraine LericheMark A. Egbert GaribaldiBird, MD, <Dictator> cc: Duncan Dulleresa Tullo, MD [facility]. Andi Mahaffy A Rilie Glanz MD ELECTRONICALLY SIGNED 05/16/2011 6:02

## 2014-09-03 NOTE — Discharge Summary (Signed)
PATIENT NAME:  Brandy Pacheco, Brandy Pacheco MR#:  [id number] DATE OF BIRTH:  1922-08-25  DATE OF ADMISSION:  05/30/2011 DATE OF DISCHARGE:  06/03/2011  DISCHARGE DIAGNOSES:  1. Severe hypokalemia, hypomagnesemia, hypophosphatemia and hypernatremia secondary to diarrhea. 2. Diarrhea, antibiotic associated. 3. Recent diagnosis of brain abscess status post antibiotics intravenously.  4. Diabetes.  5. Hypertension. 6. Advanced dementia.   HOSPITAL COURSE: 81 year old female with history of advanced dementia, diabetes and hypertension. She was recently discharged from the hospital with a diagnosis of suspected brain abscess. She has been having diarrhea because of IV antibiotics. She was receiving IV vancomycin, ceftriaxone and Flagyl through the PICC line. When she came in she was found to have severe electrolyte abnormalities. She was found to have magnesium 1.2, potassium 3.3, her creatinine was normal at 1.02, her phosphorus was also 1.8. She received aggressive replacement of magnesium, potassium and phosphorus. Her IV antibiotics were continued during the hospital stay until 06/02/2011. She already completed two weeks of IV antibiotics for her brain abscess. She had a CT head done with and without contrast at the time of admission which showed significantly decreased air within the ventricular system with only a point of air laterally in the right temporal lobe. Since there is significant improvement, initially it was planned to continue IV antibiotics for another two weeks but she continued to have diarrhea and electrolyte problems, we discussed with the family and her primary care physician, Dr. Darrick Huntsman. It was decided that we stop the IV antibiotics because she completed two week and her CT head is significantly improved and to check serial CT scans on her. So her antibiotics was stopped yesterday, 06/02/2011, after discussion with the family members and Dr. Darrick Huntsman. Her stool for C. difficile was negative. She is still  having some diarrhea. She can take Lomotil at home for diarrhea. Her electrolytes have improved; today her magnesium is 2, her potassium is 3.6, her creatinine is 1.1. Her creatinine also increased during the hospital stay to about 1.32 but that has improved. I gave her 500 mL of D5 water because of her hypernatremia today and I am going to repeat her sodium level before discharge but her BMP and magnesium level needs to be monitored as outpatient. She also has some chest congestion before. She is having some upper airway sounds. Her chest x-ray showed that she had some interstitial infiltrate, maybe a component of pulmonary edema, some pulmonary fibrosis. Her chest is clear today. She is saturating 93% on 2 liters, 94% on 0.5 liter. I am going to give her some potassium packet for another five days. Speech was consulted and speech actually gave her some education. They do not want to change diet on her so she is continued on pureed diet with thin liquids. I advised to give her yogurt and lactobacillus probiotic for about seven days because this is antibiotic associated diarrhea.   MEDICATIONS AT DISCHARGE/HOME MEDICATIONS:   1. Acetaminophen hydrocodone 5/500, 1 to 2 tabs q.6 hours as needed.  2. Seroquel 100 mg b.i.d. 3.  Zoloft 100 mg b.i.d.  4. Amlodipine 5 mg daily.  5. Lantus 10 units daily. 6. Lomotil 1 tab every six hours as needed for diarrhea. 7. Room humidifier and mouth rinses q.2 hours and massage gland t.i.d.  NEW MEDICATIONS: 1. Lactobacillus (probiotic) 1 capsule p.o. b.i.d. for seven days. 2. Klor-Con potassium chloride packet 20 mEq once daily for five days.   NOTE: Do not take MiraLax.   CONDITION AT DISCHARGE: She has  advanced dementia. She is comfortable.   PHYSICAL EXAMINATION: T-max 97.6, heart rate 98, blood pressure 129/78 to 118/73. Chest is clear. She has some upper airway sounds. Abdomen soft, nontender.   DIET: Advised a pureed diet, thin liquids, medications crushed  in ice cream. Feeding sitting patient fully upright with head forward. Straws okay - limit large amounts into the mouth. Dietary instructions have been provided to the patient.   FOLLOW UP: Follow up with Dr. Darrick Huntsmanullo in one week.   DISCHARGE INSTRUCTIONS: Home with hospice. Please check BMP and serum magnesium level in two days and then send results to Dr. Darrick Huntsmanullo and then repeat BMP and magnesium level next week. Patient will be needing serial CT scans of the brain with and without contrast as outpatient which will be ordered by Dr. Darrick Huntsmanullo.    TIME SPENT WITH DISCHARGE: 50 minutes.   ____________________________ Fredia SorrowAbhinav Williard Keller, MD ag:cms D: 06/03/2011 15:39:01 ET T: 06/04/2011 12:27:36 ET JOB#: [id number]  cc: Fredia SorrowAbhinav Markeisha Mancias, MD, <Dictator> Duncan Dulleresa Tullo, MD Fredia SorrowABHINAV Antaniya Venuti MD ELECTRONICALLY SIGNED 07/03/2011 13:52

## 2014-09-03 NOTE — Discharge Summary (Signed)
PATIENT NAME:  Brandy Pacheco, Brandy Pacheco MR#:  [id number] DATE OF BIRTH:  08/21/1922  DATE OF ADMISSION:  08/09/2011 DATE OF DISCHARGE:  08/11/2011  DISPOSITION: Patient is getting discharged to hospice home.   DISCHARGE DIAGNOSES:  1. Severe hypernatremic dehydration. 2. Acute renal failure. 3. Aspiration pneumonia. 4. Advanced dementia. 5. Hypertension. 6. Diabetes. 7. Failure to thrive with BMI of 19.2.  CONSULT: Palliative care.   HOSPITAL COURSE: An 81 year old female who has history of advanced dementia, hypertension, diabetes, she had previous history of brain abscess. She was brought into the hospital because of severe dehydration, very poor p.o. intake. She has been living at home with a daughter and granddaughter who are both nurses and they have been taking good care of her. Her p.o. intake has been declining steadily. When she came to the Emergency Room she was found to be severely dehydrated with a sodium level of greater than 160. She was in acute renal failure with a creatinine of 2.54, BUN of 76. Her chest x-ray when she came in showed she had patchy densities in the mid and lower right lung suggestive of aspiration pneumonia. She was initially admitted to Intensive Care Unit because of severe hypernatremia and dehydration but then she was moved out of the Intensive Care Unit. Her CT of the head was essentially negative. Changes of atrophy with chronic small vessel ischemic disease. She was given  dextrose-containing fluids and her sodium has improved to 143 today and her creatinine has improved to 1.82. She was also kept n.p.o. Speech was consulted and they suggested only sips of water because patient may be aspirating. She also had a central line placed because  she needed an access. Her urine cultures and blood cultures have been negative. All her p.o. medications have been held and she is just getting IV medications here. Palliative care was consulted to discuss end-of-life care with the  family members. The family members discussed with me, the palliative care doctor, Dr. Darrick Huntsman and Jeronimo Norma, hospice liaison and they understand that the patient has advanced dementia and can have recurrent hypernatremia once her IV fluids are taken off. She also has been aspirating. They decided on comfort measures and sending her to hospice home. They know her IV fluids and IV antibiotics will be stopped at hospice home. Will give pleasure feeds there. Will also give her p.o. antibiotics if she tolerates that. Will keep her off of Lantus and her antihypertensives at this time. Will continue her psych medications if she can tolerate that.   DISCHARGE INSTRUCTIONS:  1. Diet: Pleasure feeds as tolerated.  2. Activity as tolerated.  3. May crush or use liquids when appropriate.  4. Oxygen as needed.  5. Leave Foley catheter in for urinary incontinence or prevention of skin breakdown.   MEDICATIONS: 1. Roxanol 20 mg/mL 0.25 to 0.5 mL p.o. or sublingual every 1 to 2 hours p.r.n.  2. Ativan p.r.n. 0.5 to 1 mg p.o. sublingual. 3. Ranitidine 150 mg b.i.d.  4. ABHR suppository q.4-6h. as needed.   HOME MEDICATIONS:  1. Seroquel 100 mg b.i.d.  2. Zoloft 100 mg b.i.d.  3. Ceftin 500 mg p.o. b.i.d. for five days. 4. Zithromax 250 mg p.o. once daily for three days.   CONDITION AT DISCHARGE: She is very emaciated though she is slightly more alert, minimally verbal. She smiles at you. Heart rate 76, blood pressure 131/58, saturating 97% on room air. Chest is clear. Heart sounds are regular. Abdomen soft, nontender. She is awake, alert,  not oriented; severe dementia. Her potassium was 3.3 today. We are giving her IV potassium but considering she is comfort measures now we won't give her anymore IV magnesium replacement or IV potassium replacement.  CODE STATUS was discussed with Jeronimo NormaJeanie, the hospice liaison with the family members and they decided on DO NOT RESUSCITATE.       TIME SPENT WITH DISCHARGE: 50  minutes.   ____________________________ Fredia SorrowAbhinav Donnie Gedeon, MD ag:cms D: 08/11/2011 13:13:10 ET T: 08/11/2011 13:28:40 ET JOB#: [id number]  cc: Fredia SorrowAbhinav Matthieu Loftus, MD, <Dictator> Duncan Dulleresa Tullo, MD Fredia SorrowABHINAV Shenetta Schnackenberg MD ELECTRONICALLY SIGNED 08/21/2011 17:28

## 2014-09-03 NOTE — H&P (Signed)
PATIENT NAME:  Brandy Pacheco, Brandy Pacheco MR#:  [id number] DATE OF BIRTH:  [birth year]  DATE OF ADMISSION:  08/09/2011  ED REFERRING PHYSICIAN: Dr. Enedina FinnerGoli   PRIMARY CARE PHYSICIAN: Dr. Darrick Huntsmanullo    REASON FOR ADMISSION: Severe hypernatremia, Alzheimer's dementia, dehydration, acute renal failure, urinary tract infection, possible pneumonia.   CHIEF COMPLAINT: "Abnormal labs, dehydration, poor p.o. intake".   HISTORY OF PRESENT ILLNESS: The patient is an 81 year old female with a past medical history of hypertension, diabetes, Alzheimer's dementia, and history of brain abscess in the past who presented with the above chief complaint. The patient is here along with two family members, a granddaughter and a daughter who is healthcare power-of-attorney. Daughter states that the patient lives with her and over the past week to 10 days the patient hasn't really been eating anything much and she believes the patient has actually been choking on her food. Over the last three to four days, the patient has not been even drinking anything because she keeps spitting it up. The patient does have home health care services and daughter asked the nurse to check labs on Thursday. When the labs were checked, she was noted to have a sodium of 163 and a white cell count of 14 at which point the home health care nurse suggested the patient come to the ED for further workup and evaluation. In the ED, the patient was noted to have a sodium level greater than 160, white cell count of 13, and possible urinary tract infection and aspiration versus community-acquired pneumonia on her chest x-ray. She was admitted for further inpatient workup and management.   PAST MEDICAL HISTORY: 1. History of brain abscess.  2. Diabetes. 3. Hypertension. 4. Alzheimer's dementia.  PAST SURGICAL HISTORY:  1. Cataract surgery. 2. Cholecystectomy. 3. Goiter removal.   ALLERGIES: No known drug allergies.   CURRENT MEDICATIONS:  1. Acetaminophen/hydrocodone  5/500 one to two tabs q.6 hours as needed. 2. Seroquel 100 mg b.i.d. 3. Zoloft 100 mg b.i.d. 4. Amlodipine 5 mg daily. 5. Lantus 10 units daily.  6. Lomotil one tab every six hours as needed for diarrhea.  7. Room humidifier q.2 hours and massage salivary glands t.i.d. 8. Lactobacillus one capsule p.o. b.i.d. for 7 days, which is now stopped.  SOCIAL HISTORY: The patient stopped smoking 30 years ago.  FAMILY HISTORY: Significant for stroke.   REVIEW OF SYSTEMS: Unable to obtain from the patient because of her dementia. Per the family member at the bedside, health care power-of-attorney, the patient has not been eating, poor p.o. intake, and decreased urine output has been noted.   PHYSICAL EXAMINATION:  VITAL SIGNS: Temperature 99.2, pulse 89, respirations 16, blood pressure 170/92.  GENERAL: Mildly cachectic white female in no acute respiratory distress.   HEENT: Pupils equal, round, reactive to light. Sclerae anicteric. Oral mucosa is dry. Oropharynx is clear. Nasopharynx is clear.  NECK: Supple. No JVD. No lymphadenopathy. No thyromegaly.  CARDIOVASCULAR: Regular rate, regular rhythm. There is a 1 to 2 out of 6 systolic murmur at the lower left sternal border, otherwise, no rubs or gallops noted.    LUNGS: Clear to auscultation bilaterally. No dullness to percussion. She is not using any accessory muscles.  ABDOMEN: Soft, nontender, and nondistended. Positive bowel sounds. No hepatosplenomegaly.  EXTREMITIES: No lower edema is noted.  NEUROLOGIC: Cranial nerves II through XII appear to be intact. Difficult getting her to cooperate with exam. She is alert to person, time, and place.  SKIN: Skin is dry.  LABORATORY,  DIAGNOSTIC, AND RADIOLOGICAL DATA: Sodium greater than 160, potassium 4.1, chloride 127, bicarb 22, BUN 76, creatinine 2.54. At her last discharge her BUN was 12 and creatinine was 1.12. Albumin is 3.2, AST 40, ALT 26. Troponin 0.04. White cell count 13.1, hemoglobin  13.9, hematocrit 42.5, platelet count 164. She did have a urinalysis done that showed 3+ bacteria, 16 white blood cells per high-power field, calcium oxalate crystals present also.  She also had an EKG done that showed rate of about 90, normal sinus rhythm, mild left axis deviation, otherwise no acute ST-T wave changes noted. The patient also had a CT of the head that showed no acute intracranial hemorrhage, soft tissues unremarkable, mild small vessel ischemia is noted. Chest x-ray by my read shows clear bases, possible right lower lobe, right middle lobe infiltrate.   ASSESSMENT AND PLAN: This is an 81 year old female with past medical history of hypertension, Alzheimer's dementia, and poor p.o. intake who is being admitted for dehydration and hypernatremia. 1. Hypernatremia secondary to moderate dehydration and poor p.o. intake over the last 5 to 7 days. Sodium is greater than 160. Two days ago it was reported as 163. I suspect she has become more dehydrated over the last two days based on the history obtained from the health care power-of-attorney. Her sodium is probably about 165 to 170. At this time will not reduce her sodium more than 10 to 12 mEq over the next 24 hours. Her goal sodium would be about 155 to 158 over the next 24 hours. Will start D5 half normal saline at 50 mL an hour. Monitor her sodium q.2 hours. Will have central line placement done. Admit patient to ICU. Check blood and urine cultures. CT of the head again with no acute intracranial process, no acute cerebral edema.  2. Dehydration secondary to poor p.o. intake most likely from advanced dementia, possible urinary tract infection. Will continue with Seroquel if able to tolerate p.o. Continue with IV fluids and supportive care. 3. Pneumonia, aspiration versus community acquired. Based on her chest x-ray read, she does have possible infiltrates that are right middle and right lower lobe. Will continue Rocephin and azithromycin and  aspiration precautions. 4. Acute renal failure secondary to dehydration and possible urinary tract infection. Continue with IV fluids and monitor BMP.  5. Possible urinary tract infection. Check urine culture. She does have 16 white blood cells and 3+ bacteria on her urinalysis. Continue with Rocephin. 6. GI and DVT prophylaxis. Continue PPI and heparin.    TIME SPENT DICTATING AND EVALUATION: 45 minutes.   CODE STATUS: The patient is a LIMITED CODE only to have antiarrhythmic drugs.  ____________________________ Stephanie Acre, MD vm:drc D: 08/09/2011 03:52:15 ET T: 08/09/2011 09:49:16 ET JOB#: [id number]  cc: Stephanie Acre, MD, <Dictator> Duncan Dull, MD Stephanie Acre MD ELECTRONICALLY SIGNED 08/09/2011 17:43

## 2014-09-03 NOTE — Discharge Summary (Signed)
PATIENT NAME:  Brandy Pacheco, Brandy Pacheco MR#:  [id number] DATE OF BIRTH:  01-Sep-1922  DATE OF ADMISSION:  05/30/2011 DATE OF DISCHARGE:  06/03/2011  ADDENDUM: Repeat sodium has improved to 146. This needs to be repeated as an outpatient. I also reviewed and updated her MOST form and her PICC line will be removed before discharge because she is no longer receiving IV antibiotics. Please check a BMP and serum magnesium in two days. ____________________________ Fredia SorrowAbhinav Thom Ollinger, MD ag:slb D: 06/03/2011 15:52:44 ET T: 06/04/2011 12:38:49 ET JOB#: [id number]  cc: Fredia SorrowAbhinav Onesti Bonfiglio, MD, <Dictator> Duncan Dulleresa Tullo, MD Fredia SorrowABHINAV Savita Runner MD ELECTRONICALLY SIGNED 07/03/2011 13:50

## 2014-09-03 NOTE — Consult Note (Signed)
PATIENT NAME:  Brandy Pacheco, Brandy Pacheco MR#:  [id number] DATE OF BIRTH:  December 27, 1922  DATE OF CONSULTATION:  05/16/2011  REFERRING PHYSICIAN:  Dr. Natale LayMark Pacheco  CONSULTING PHYSICIAN:  Brandy BarefootJ. Brandy Toniyah Dilmore, MD  HISTORY OF PRESENT ILLNESS: The patient is an 81 year old demented white female admitted for left neck swelling, pain and dysphagia. The patient also has had fairly acute progression of dementia over the past three months. The patient has had surgical consultation with Dr. Egbert Pacheco who recommended Pacheco consultation and Rocephin. Of note, the patient was noted to have an air-fluid level in the right anterior horn of the ventricle as well as air in the temporal lobe. The neck mass "got large very quickly" according to the patient's son and daughter.   ALLERGIES: None.   MEDICATIONS: Rocephin. Other medications are documented in the chart.   PHYSICAL EXAMINATION:  GENERAL: Elderly white female, very hard of hearing but otherwise responsive.   NECK: There is a 3 x 3 cm very firm mass in the left submandibular gland. There is no overlying erythema. No purulence from the skin.   ORAL CAVITY AND OROPHARYNX: Patient is edentulous. There is white crusting just anterior and posterior to the inferior alveolar ridge consistent with denture paste.   IMPRESSION: Probable acute left submandibular sialadenitis with cellulitis. The patient has responded very well to the Rocephin and I would encourage completing the course of IV and oral antibiotics, however, I am not convinced after reviewing the CT scan myself that this is not a submandibular gland malignancy. That may help explain the brain lesion although brain metastasis from salivary gland malignancies are very infrequent. I also do not see anything else on the head CT that would explain the pneumocephalus including review of the temporal bones and paranasal sinuses.   RECOMMENDATIONS: I have recommended the sialadenitis protocol which would include increased hydration, lemon  swabs, humidifier at the bedside and massage of the gland. This can be done by the nursing staff as well as by the family. In addition, I would recommend repeat CT scan of the head to see if there has been clearing of the air. Also I would recommend a fine needle aspiration of the neck mass if this persists after the infection has been controlled.  ____________________________ Brandy CommonsJ. Pacheco BaronMadison Brandy Gladwin, MD jmc:cms D: 05/16/2011 16:36:09 ET T: 05/17/2011 10:19:20 ET JOB#: [id number]  cc: Brandy BarefootJ. Brandy Jerrold Haskell, MD, <Dictator> Brandy PeachSonya Pacheco - Metolius Pacheco Brandy CoppJMADISON Azarias Chiou MD ELECTRONICALLY SIGNED 06/20/2011 7:37

## 2014-09-03 NOTE — Op Note (Signed)
PATIENT NAME:  Brandy NixonDAVIS, [name] MR#:  [id number] DATE OF BIRTH:  August 13, 1922  DATE OF PROCEDURE:  05/16/2011  PREOPERATIVE DIAGNOSIS:  1. Brain mass concerning for abscess.  2. Neck mass concerning for abscess versus malignancy.  3. Inability to take per os secondary to neck mass.  4. Intravenous antibiotics required for greater than 5 days.   POSTOPERATIVE DIAGNOSIS:  1. Brain mass concerning for abscess.  2. Neck mass concerning for abscess versus malignancy.  3. Inability to take per os secondary to neck mass.  4. Intravenous antibiotics required for greater than 5 days.   PROCEDURE PERFORMED: Insertion of right basilic PICC line.   SURGEON: Renford DillsGregory G. Schnier, MD    DESCRIPTION OF PROCEDURE: The patient is brought to the Cardiac Cath Lab and placed in the supine position with the right arm extended palm upward. The right arm is prepped and draped in a sterile fashion. Ultrasound is placed in a sterile sleeve. Ultrasound is utilized secondary to lack of appropriate landmarks to avoid vascular injury. Under direct ultrasound visualization, the basilic vein is identified. It is echolucent, homogeneous, and easily compressible indicating patency. One percent lidocaine is infiltrated in the soft tissues of the skin, and access to the right basilic vein is obtained with a micropuncture needle. A microwire is then advanced under fluoroscopic guidance and positioned with its tip at the atriocaval junction. Measurements are then made, and a Morpheus double lumen PICC line is then trimmed to the appropriate length, 33 cm, and advanced over the wire through the peel-away sheath. The peel-away sheath and wire were removed. Both lumens aspirate and flush easily, and the PICC line is secured to the skin with a StatLock and sterile dressing. The patient tolerated the procedure without change in her condition, and she is taken to the floor in unchanged condition.  ____________________________ Renford DillsGregory G. Schnier,  MD ggs:cbb D: 05/16/2011 14:03:34 ET T: 05/16/2011 15:20:19 ET JOB#: [id number] cc: Duncan Dulleresa Tullo, MD Renford DillsGREGORY G SCHNIER MD ELECTRONICALLY SIGNED 05/31/2011 12:28

## 2014-09-03 NOTE — H&P (Signed)
PATIENT NAME:  Brandy Pacheco, Brandy Pacheco MR#:  [id number] DATE OF BIRTH:  1922-05-19  DATE OF ADMISSION:  05/14/2011  PRIMARY CARE PHYSICIAN: Dr. Darrick Huntsman  CHIEF COMPLAINT: Left anterior neck cellulitis.   HISTORY OF PRESENT ILLNESS: Patient is an 81 year old female presents with chief complaint of loss of appetite, trouble swallowing and redness around the left side of the neck. CT scan of the neck was performed in the Emergency Room which showed left neck cyst versus abscess. Patient is noted to be hypernatremic, sodium is 147.   PAST MEDICAL HISTORY:  1. Diabetes.  2. Hypertension. 3. Urinary tract infection. 4. Alzheimer's dementia.   PAST SURGICAL HISTORY:  1. Cataract surgery.  2. Cholecystectomy.  3. Goiter removal from her thyroid.   SOCIAL HISTORY: She used to be a smoker, quit more than 30 years ago. She currently uses snuff.   FAMILY HISTORY: Mother died, had a stroke.   ALLERGIES: No known drug allergies.   CURRENT MEDICATIONS:  1. Acetaminophen/hydrocodone 500 mg/5 mg 1 to 2 every six hours p.r.n.  2. Amlodipine 10 mg p.o. daily.  3. Aricept 10 mg p.o. daily.  4. Caltrate 600 mg with vitamin D daily.  5. Coreg 6.25 mg p.o. b.i.d.  6. Gabapentin 300 mg p.o. t.i.d.  7. Humulin 70/30, 20 units daily. 8. Losartan 100 mg p.o. daily.  9. Namenda 10 mg p.o. daily.  10. Nystatin apply to groin b.i.d.  11. Septra DS 2 times daily. 12.  Sertraline 100 mg p.o. daily.  13. Spironolactone 50 mg p.o. b.i.d.   REVIEW OF SYSTEMS: CONSTITUTIONAL: Patient denies fevers, chills, night sweats. HEENT: Patient denies any hearing loss, dysphagia, visual problems, sore throat. CARDIOVASCULAR: Patient denies any chest pain, orthopnea, PND. RESPIRATORY: Patient denies any cough, wheezing, hemoptysis. GASTROINTESTINAL: Patient denies any abdominal pain, hematemesis, hematochezia, melena. GENITOURINARY: Patient denies any hematuria, dysuria, frequency. NEUROLOGIC: Patient denies any headache, focal  weakness, seizures. SKIN: Patient denies any lesions, rash. ENDOCRINE: Patient denies polyuria, polyphagia, polydipsia. MUSCULOSKELETAL: Patient denies any arthritis, joint effusion, joint swelling.   PHYSICAL EXAMINATION:  VITAL SIGNS: Temperature 98.6, heart rate 69, respirations 18, blood pressure 149/63, oxygen saturation 94%.   HEENT: Atraumatic, normocephalic. Pupils are equal, round, and reactive to light and accommodation. Extraocular movements intact. Sclerae anicteric. Mucous membranes dry.   NECK: Supple. No organomegaly.   CARDIOVASCULAR: S1, S2, regular rate, rhythm. No gallops. No thrills. No murmurs.   RESPIRATORY: Lungs are clear to auscultation. No rales, no rhonchi, no wheezes, no bronchial breath sounds.   GASTROINTESTINAL: Abdomen is soft, nontender, nondistended. Normal bowel sounds. No hepatosplenomegaly.   GENITOURINARY: There is no hematuria or masses noted.   SKIN: No lesions. No rash.   ENDOCRINE: No masses. No thyromegaly.   LYMPH: No lymphadenopathy or nodes palpable.   NEUROLOGIC: Cranial nerves II through XII grossly intact. Motor strength is 5/5 bilateral upper and lower extremities. Sensation within normal limits. No focal neurological deficit noted on examination.   MUSCULOSKELETAL: No arthritis, joint effusion, swelling.   HEMATOLOGICAL: No ecchymosis, no bleeding, no petechia.   EXTREMITIES: No cyanosis, no clubbing, no edema.   LABORATORY, DIAGNOSTIC AND RADIOLOGICAL DATA: WBC 19,100, hemoglobin 13.4, hematocrit 39.1, platelet count 189, glucose 220, BUN 31, creatinine 1.07, sodium 147, potassium 3.6, chloride 107, CO2 32, calcium 9.5, total bilirubin 0.6, alkaline phosphatase 96, ALT 25, AST 27, total protein 8.3, albumin 3.3. Estimated GFR 52.   ASSESSMENT AND PLAN:  1. Patient is an 81 year old female presents with chief complaint of left anterior  neck abscess versus cyst. Admit to medical/surgical floor. Start IV fluids, Zosyn, vancomycin.  Surgical consultation.  2. Hypernatremia. Monitor sodium levels closely, will likely improve with IV fluids. 3. Hypertension. Continue Coreg, amlodipine, losartan, spironolactone.  4. Diabetes. Accu-Cheks. Insulin sliding scale. Continue Humulin. 5. Dementia. Continue Aricept.  6. Depression. Continue sertraline.  7. Deep vein thrombosis prophylaxis. Lovenox.    ____________________________ Donia AstJignesh S. Brett Soza, MD jsp:cms D: 05/14/2011 06:40:14 ET T: 05/14/2011 09:44:44 ET JOB#: [id number]  cc: Donia AstJignesh S. Kraig Genis, MD, <Dictator> Duncan Dulleresa Tullo, MD Donia AstJIGNESH S Kiari Hosmer MD ELECTRONICALLY SIGNED 05/14/2011 21:46
